# Patient Record
Sex: Female | Born: 1990 | Race: White | Hispanic: No | Marital: Married | Smoking: Never smoker
Health system: Southern US, Community
[De-identification: ages and names within clinical notes are randomized; demographics above are authoritative.]

## PROBLEM LIST (undated history)

## (undated) DIAGNOSIS — B589 Toxoplasmosis, unspecified: Secondary | ICD-10-CM

## (undated) DIAGNOSIS — F32A Depression, unspecified: Secondary | ICD-10-CM

## (undated) DIAGNOSIS — F419 Anxiety disorder, unspecified: Secondary | ICD-10-CM

## (undated) HISTORY — DX: Anxiety disorder, unspecified: F41.9

## (undated) HISTORY — DX: Toxoplasmosis, unspecified: B58.9

## (undated) HISTORY — DX: Depression, unspecified: F32.A

---
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
## 2008-07-16 ENCOUNTER — Emergency Department (HOSPITAL_COMMUNITY): Admission: EM | Admit: 2008-07-16 | Discharge: 2008-07-16 | Payer: Self-pay | Admitting: Emergency Medicine

## 2018-12-03 ENCOUNTER — Other Ambulatory Visit (HOSPITAL_COMMUNITY): Payer: Self-pay | Admitting: Internal Medicine

## 2018-12-03 ENCOUNTER — Ambulatory Visit (HOSPITAL_COMMUNITY)
Admission: RE | Admit: 2018-12-03 | Discharge: 2018-12-03 | Disposition: A | Discharge status: Home / Self Care | Payer: BC Managed Care – PPO | Source: Ambulatory Visit | Attending: Internal Medicine | Admitting: Internal Medicine

## 2018-12-03 DIAGNOSIS — R079 Chest pain, unspecified: Secondary | ICD-10-CM | POA: Diagnosis not present

## 2019-10-25 ENCOUNTER — Other Ambulatory Visit: Payer: Self-pay

## 2019-10-25 DIAGNOSIS — Z20822 Contact with and (suspected) exposure to covid-19: Secondary | ICD-10-CM

## 2019-10-27 LAB — NOVEL CORONAVIRUS, NAA: SARS-CoV-2, NAA: NOT DETECTED

## 2019-12-12 ENCOUNTER — Ambulatory Visit: Payer: BC Managed Care – PPO | Attending: Internal Medicine

## 2019-12-12 ENCOUNTER — Other Ambulatory Visit: Payer: Self-pay

## 2019-12-12 DIAGNOSIS — Z20822 Contact with and (suspected) exposure to covid-19: Secondary | ICD-10-CM

## 2019-12-13 LAB — NOVEL CORONAVIRUS, NAA: SARS-CoV-2, NAA: NOT DETECTED

## 2020-08-14 ENCOUNTER — Other Ambulatory Visit: Payer: Self-pay

## 2020-08-14 ENCOUNTER — Ambulatory Visit
Admission: EM | Admit: 2020-08-14 | Discharge: 2020-08-14 | Discharge status: Absent without leave | Payer: BC Managed Care – PPO

## 2020-11-21 ENCOUNTER — Other Ambulatory Visit: Payer: Self-pay

## 2020-11-21 ENCOUNTER — Ambulatory Visit (INDEPENDENT_AMBULATORY_CARE_PROVIDER_SITE_OTHER): Payer: BC Managed Care – PPO | Admitting: Orthopaedic Surgery

## 2020-11-21 ENCOUNTER — Encounter: Payer: Self-pay | Admitting: Orthopaedic Surgery

## 2020-11-21 DIAGNOSIS — M7711 Lateral epicondylitis, right elbow: Secondary | ICD-10-CM | POA: Diagnosis not present

## 2020-11-21 MED ORDER — LIDOCAINE HCL 1 % IJ SOLN
1.0000 mL | INTRAMUSCULAR | Status: AC | PRN
  Administered 2020-11-21: 1 mL

## 2020-11-21 NOTE — Progress Notes (Signed)
   Office Visit Note   Patient: Autumn Fields           Date of Birth: 05-09-1991           MRN: [id number] Visit Date: 11/21/2020              Requested by: Carylon Perches, MD [address] [place],  [place] [address] PCP: Carylon Perches, MD   Assessment & Plan: Visit Diagnoses:  1. Right tennis elbow     Plan: Exam and history consistent with lateral epicondylitis right elbow.  Long discussion regarding treatment options.  Ms. Lehtinen would like to try cortisone injection.  We will monitor her response.  No work tomorrow or Friday  Follow-Up Instructions: Return if symptoms worsen or fail to improve.   Orders:  Orders Placed This Encounter  Procedures  . Hand/UE Inj   No orders of the defined types were placed in this encounter.     Procedures: Hand/UE Inj for lateral epicondylitis on 11/21/2020 2:30 PM Details: 27 G needle, lateral approach Medications: 1 mL lidocaine 1 %  1 mL betamethasone      Clinical Data: No additional findings.   Subjective: Chief Complaint  Patient presents with  . Right Elbow - Pain  Patient presents today for right elbow pain. No known injury. She has been hurting for around 3 months on the lateral side of her elbow. She describes it as a "burning down the arm". She said that it aches constantly, but gets worse with lifting anything, or using that arm. 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 She is right hand dominant. She has taken Ibuprofen but states that it does not help.   HPI  Review of Systems   Objective: Vital Signs: Ht 5\' 8"  (1.727 m)   Wt 165 lb (74.8 kg)   BMI 25.09 kg/m   Physical Exam Constitutional:      Appearance: She is well-developed.  Eyes:     Pupils: Pupils are equal, round, and reactive to light.  Pulmonary:     Effort: Pulmonary effort is normal.  Skin:    General: Skin is warm and dry.  Neurological:     Mental Status: She is alert and oriented to person, place, and time.    Psychiatric:        Behavior: Behavior normal.     Ortho Exam wake alert and oriented x3.  Comfortable sitting.  Pain over the lateral epicondyles of right elbow without any skin changes.  Full range of motion.  Pain with grip and extension but not flexion.  Neurologically intact.  Full range of motion of elbow and wrist Specialty Comments:  No specialty comments available.  Imaging: No results found.   PMFS History: Patient Active Problem List   Diagnosis Date Noted  . Right tennis elbow 11/21/2020   Past Medical History:  Diagnosis Date  . Anxiety   . Depression   . Toxoplasmosis     History reviewed. No pertinent family history.  History reviewed. No pertinent surgical history. Social History   Occupational History  . Not on file  Tobacco Use  . Smoking status: Never Smoker  . Smokeless tobacco: Never Used  Substance and Sexual Activity  . Alcohol use: Not on file  . Drug use: Not on file  . Sexual activity: Not on file

## 2021-01-30 ENCOUNTER — Encounter: Payer: Self-pay | Admitting: Orthopaedic Surgery

## 2021-01-30 ENCOUNTER — Ambulatory Visit (INDEPENDENT_AMBULATORY_CARE_PROVIDER_SITE_OTHER): Payer: BC Managed Care – PPO | Admitting: Orthopaedic Surgery

## 2021-01-30 ENCOUNTER — Ambulatory Visit: Payer: BC Managed Care – PPO | Admitting: Orthopaedic Surgery

## 2021-01-30 ENCOUNTER — Other Ambulatory Visit: Payer: Self-pay

## 2021-01-30 VITALS — Ht 68.0 in | Wt 165.0 lb

## 2021-01-30 DIAGNOSIS — M25521 Pain in right elbow: Secondary | ICD-10-CM

## 2021-01-30 DIAGNOSIS — M7711 Lateral epicondylitis, right elbow: Secondary | ICD-10-CM

## 2021-01-30 NOTE — Progress Notes (Signed)
   Office Visit Note   Patient: Autumn Fields           Date of Birth: 08-12-1991           MRN: [id number] Visit Date: 01/30/2021              Requested by: Carylon Perches, MD [address] [place],  [place] [address] PCP: Carylon Perches, MD   Assessment & Plan: Visit Diagnoses:  1. Right tennis elbow   2. Right elbow pain     Plan: Recurrent symptoms of right tennis elbow.  Injected the elbow about 2 months ago with good relief only to have it recur.  Now having pain directly over the lateral epicondyle particularly with grip and extension.  Will order MRI scan to confirm diagnosis and try a course of physical therapy.  Briefly discussed possible surgical release  Follow-Up Instructions: Return After MRI scan right elbow.   Orders:  Orders Placed This Encounter  Procedures  . MR Elbow Right w/o contrast  . Ambulatory referral to Physical Therapy   No orders of the defined types were placed in this encounter.     Procedures: No procedures performed   Clinical Data: No additional findings.   Subjective: Chief Complaint  Patient presents with  . Right Elbow - Follow-up  Patient presents today for recurrent right elbow pain. She was here in December and received a cortisone injection. She said that it helped, but has since gotten worse. She tried a brace but it offered no relief. She applies ice in the evening.   HPI  Review of Systems   Objective: Vital Signs: Ht 5\' 8"  (1.727 m)   Wt 165 lb (74.8 kg)   BMI 25.09 kg/m   Physical Exam Constitutional:      Appearance: She is well-developed and well-nourished.  HENT:     Mouth/Throat:     Mouth: Oropharynx is clear and moist.  Eyes:     Extraocular Movements: EOM normal.     Pupils: Pupils are equal, round, and reactive to light.  Pulmonary:     Effort: Pulmonary effort is normal.  Skin:    General: Skin is warm and dry.  Neurological:     Mental Status: She is alert and oriented to person, place, and  time.  Psychiatric:        Mood and Affect: Mood and affect normal.        Behavior: Behavior normal.     Ortho Exam full range of motion right elbow with local tenderness directly over the lateral epicondyle.  Skin intact.  Good grip and release but there is pain over the lateral condyle with grip and extension more than with flexion.  Full pronation and supination.  Neurologically intact Specialty Comments:  No specialty comments available.  Imaging: No results found.   PMFS History: Patient Active Problem List   Diagnosis Date Noted  . Right tennis elbow 11/21/2020   Past Medical History:  Diagnosis Date  . Anxiety   . Depression   . Toxoplasmosis     History reviewed. No pertinent family history.  History reviewed. No pertinent surgical history. Social History   Occupational History  . Not on file  Tobacco Use  . Smoking status: Never Smoker  . Smokeless tobacco: Never Used  Substance and Sexual Activity  . Alcohol use: Not on file  . Drug use: Not on file  . Sexual activity: Not on file

## 2021-01-31 ENCOUNTER — Other Ambulatory Visit: Payer: Self-pay

## 2021-01-31 ENCOUNTER — Telehealth: Payer: Self-pay

## 2021-01-31 DIAGNOSIS — M7711 Lateral epicondylitis, right elbow: Secondary | ICD-10-CM

## 2021-01-31 DIAGNOSIS — M25521 Pain in right elbow: Secondary | ICD-10-CM

## 2021-01-31 NOTE — Telephone Encounter (Signed)
Crystal from [facility] called regarding referral that they received she is requesting the referral get switched to occupational therapy instead of physical therapy call back: [phone]

## 2021-01-31 NOTE — Telephone Encounter (Signed)
It has been changed to OT.

## 2021-02-18 ENCOUNTER — Ambulatory Visit (HOSPITAL_COMMUNITY): Payer: BC Managed Care – PPO

## 2021-02-20 ENCOUNTER — Ambulatory Visit (HOSPITAL_COMMUNITY)
Admission: RE | Admit: 2021-02-20 | Discharge: 2021-02-20 | Disposition: A | Discharge status: Home / Self Care | Payer: BC Managed Care – PPO | Source: Ambulatory Visit | Attending: Orthopaedic Surgery | Admitting: Orthopaedic Surgery

## 2021-02-20 ENCOUNTER — Other Ambulatory Visit: Payer: Self-pay

## 2021-02-20 DIAGNOSIS — M25521 Pain in right elbow: Secondary | ICD-10-CM | POA: Diagnosis not present

## 2021-02-27 ENCOUNTER — Other Ambulatory Visit: Payer: Self-pay

## 2021-02-27 ENCOUNTER — Encounter: Payer: Self-pay | Admitting: Orthopaedic Surgery

## 2021-02-27 ENCOUNTER — Ambulatory Visit (INDEPENDENT_AMBULATORY_CARE_PROVIDER_SITE_OTHER): Payer: BC Managed Care – PPO | Admitting: Orthopaedic Surgery

## 2021-02-27 VITALS — Ht 68.0 in | Wt 165.0 lb

## 2021-02-27 DIAGNOSIS — M7711 Lateral epicondylitis, right elbow: Secondary | ICD-10-CM | POA: Diagnosis not present

## 2021-02-27 DIAGNOSIS — M25521 Pain in right elbow: Secondary | ICD-10-CM | POA: Diagnosis not present

## 2021-02-27 NOTE — Progress Notes (Signed)
   Office Visit Note   Patient: Autumn Fields           Date of Birth: 1991-10-26           MRN: [id number] Visit Date: 02/27/2021              Requested by: Carylon Perches, MD [address] [place],  [place] [address] PCP: Carylon Perches, MD   Assessment & Plan: Visit Diagnoses:  1. Right elbow pain   2. Right tennis elbow     Plan: MRI scan of right elbow demonstrates common forearm extensor origin mild intrasubstance increased T2 signal consistent with lateral epicondylitis.  No tear.  Long discussion regarding the above.  Has had 1 cortisone injection in December with some relief but temporary.  Would like to try a course of physical therapy and knee pain.  Return in 1 month.  Always could consider surgery.  Continue with the tennis elbow band  Follow-Up Instructions: Return in about 1 month (around 03/30/2021).   Orders:  Orders Placed This Encounter  Procedures  . Ambulatory referral to Physical Therapy   No orders of the defined types were placed in this encounter.     Procedures: No procedures performed   Clinical Data: No additional findings.   Subjective: Chief Complaint  Patient presents with  . Right Elbow - Follow-up    MRI review  Patient presents today for follow up on her right elbow. She had an MRI and is here today to go over those results.  No change in symptoms  HPI  Review of Systems   Objective: Vital Signs: Ht 5\' 8"  (1.727 m)   Wt 165 lb (74.8 kg)   BMI 25.09 kg/m   Physical Exam Constitutional:      Appearance: She is well-developed and well-nourished.  HENT:     Mouth/Throat:     Mouth: Oropharynx is clear and moist.  Eyes:     Extraocular Movements: EOM normal.     Pupils: Pupils are equal, round, and reactive to light.  Pulmonary:     Effort: Pulmonary effort is normal.  Skin:    General: Skin is warm and dry.  Neurological:     Mental Status: She is alert and oriented to person, place, and time.  Psychiatric:        Mood  and Affect: Mood and affect normal.        Behavior: Behavior normal.     Ortho Exam right elbow with mild tenderness over the lateral epicondyle.  Full flexion extension.  Mild pain over the lateral epicondyle with grip and extension and minimally in flexion.  Neurologically intact  Specialty Comments:  No specialty comments available.  Imaging: No results found.   PMFS History: Patient Active Problem List   Diagnosis Date Noted  . Right tennis elbow 11/21/2020   Past Medical History:  Diagnosis Date  . Anxiety   . Depression   . Toxoplasmosis     History reviewed. No pertinent family history.  History reviewed. No pertinent surgical history. Social History   Occupational History  . Not on file  Tobacco Use  . Smoking status: Never Smoker  . Smokeless tobacco: Never Used  Substance and Sexual Activity  . Alcohol use: Not on file  . Drug use: Not on file  . Sexual activity: Not on file

## 2021-03-01 ENCOUNTER — Telehealth: Payer: Self-pay

## 2021-03-01 ENCOUNTER — Other Ambulatory Visit: Payer: Self-pay

## 2021-03-01 DIAGNOSIS — M25521 Pain in right elbow: Secondary | ICD-10-CM

## 2021-03-01 DIAGNOSIS — M7711 Lateral epicondylitis, right elbow: Secondary | ICD-10-CM

## 2021-03-01 NOTE — Telephone Encounter (Signed)
Crystal from [facility] called regarding a referral they received for patient to have therapy on her right elbow she is requesting the  referral to get changed to occupational therapy instead Fax:[phone] Call [phone]

## 2021-03-01 NOTE — Telephone Encounter (Signed)
Ordered entered 

## 2022-07-28 ENCOUNTER — Ambulatory Visit (INDEPENDENT_AMBULATORY_CARE_PROVIDER_SITE_OTHER): Payer: BC Managed Care – PPO

## 2022-07-28 ENCOUNTER — Ambulatory Visit
Admission: EM | Admit: 2022-07-28 | Discharge: 2022-07-28 | Disposition: A | Discharge status: Home / Self Care | Payer: BC Managed Care – PPO | Attending: Family Medicine | Admitting: Family Medicine

## 2022-07-28 DIAGNOSIS — R0602 Shortness of breath: Secondary | ICD-10-CM

## 2022-07-28 DIAGNOSIS — J209 Acute bronchitis, unspecified: Secondary | ICD-10-CM

## 2022-07-28 DIAGNOSIS — R059 Cough, unspecified: Secondary | ICD-10-CM

## 2022-07-28 MED ORDER — ALBUTEROL SULFATE HFA 108 (90 BASE) MCG/ACT IN AERS: 1.0000 | INHALATION_SPRAY | Freq: Four times a day (QID) | RESPIRATORY_TRACT | 0 refills | Status: AC | PRN

## 2022-07-28 MED ORDER — DEXAMETHASONE SODIUM PHOSPHATE 10 MG/ML IJ SOLN
10.0000 mg | Freq: Once | INTRAMUSCULAR | Status: AC
  Administered 2022-07-28: 10 mg via INTRAMUSCULAR

## 2022-07-28 MED ORDER — PROMETHAZINE-DM 6.25-15 MG/5ML PO SYRP: 5.0000 mL | ORAL_SOLUTION | Freq: Four times a day (QID) | ORAL | 0 refills | Status: AC | PRN

## 2022-07-28 NOTE — ED Provider Notes (Signed)
[facility]    CSN: [id number] Arrival date & time: 07/28/22  0813      History   Chief Complaint Chief Complaint  Patient presents with   Cough    HPI Autumn Fields is a 31 y.o. female.   Presenting today with ongoing chest tightness, cough, mild shortness of breath following COVID infection 12 days ago.  Completed course of Paxlovid which helped with her other symptoms but not her chest symptoms.  Denies ongoing fever, chest pain, sore throat, congestion, abdominal pain, nausea vomiting or diarrhea.  Not currently taking any medications for symptoms.   Past Medical History:  Diagnosis Date   Anxiety    Depression    Toxoplasmosis     Patient Active Problem List   Diagnosis Date Noted   Right tennis elbow 11/21/2020    History reviewed. No pertinent surgical history.  OB History   No obstetric history on file.      Home Medications    Prior to Admission medications   Medication Sig Start Date End Date Taking? Authorizing Provider  albuterol (VENTOLIN HFA) 108 (90 Base) MCG/ACT inhaler Inhale 1-2 puffs into the lungs every 6 (six) hours as needed for wheezing or shortness of breath. 07/28/22  Yes Particia Nearing, PA-C  citalopram (CELEXA) 20 MG tablet Take 20 mg by mouth daily. 10/11/20  Yes [provider]  promethazine-dextromethorphan (PROMETHAZINE-DM) 6.25-15 MG/5ML syrup Take 5 mLs by mouth 4 (four) times daily as needed. 07/28/22  Yes Particia Nearing, PA-C  ALPRAZolam Prudy Feeler) 0.5 MG tablet Take 0.5 mg by mouth 2 (two) times daily as needed. 10/11/20   [provider]  TRI-SPRINTEC 0.18/0.215/0.25 MG-35 MCG tablet Take 1 tablet by mouth daily. 09/04/20   [provider]    Family History History reviewed. No pertinent family history.  Social History Social History   Tobacco Use   Smoking status: Never   Smokeless tobacco: Never     Allergies   Patient has no known allergies.   Review of  Systems Review of Systems Per HPI  Physical Exam Triage Vital Signs ED Triage Vitals  Enc Vitals Group     BP 07/28/22 0852 116/80     Pulse Rate 07/28/22 0852 82     Resp 07/28/22 0852 18     Temp 07/28/22 0852 98.6 F (37 C)     Temp src --      SpO2 07/28/22 0852 99 %     Weight --      Height --      Head Circumference --      Peak Flow --      Pain Score 07/28/22 0835 0     Pain Loc --      Pain Edu? --      Excl. in GC? --    No data found.  Updated Vital Signs BP 116/80   Pulse 82   Temp 98.6 F (37 C)   Resp 18   LMP 07/15/2022   SpO2 99%   Visual Acuity Right Eye Distance:   Left Eye Distance:   Bilateral Distance:    Right Eye Near:   Left Eye Near:    Bilateral Near:     Physical Exam Vitals and nursing note reviewed.  Constitutional:      Appearance: Normal appearance. She is not ill-appearing.  HENT:     Head: Atraumatic.     Right Ear: Tympanic membrane normal.     Left Ear:  Tympanic membrane normal.     Nose: Nose normal.     Mouth/Throat:     Mouth: Mucous membranes are moist.  Eyes:     Extraocular Movements: Extraocular movements intact.     Conjunctiva/sclera: Conjunctivae normal.  Cardiovascular:     Rate and Rhythm: Normal rate and regular rhythm.     Heart sounds: Normal heart sounds.  Pulmonary:     Effort: Pulmonary effort is normal.     Breath sounds: Wheezing present. No rales.     Comments: Trace wheezes right Musculoskeletal:        General: Normal range of motion.     Cervical back: Normal range of motion and neck supple.  Skin:    General: Skin is warm and dry.  Neurological:     Mental Status: She is alert and oriented to person, place, and time.  Psychiatric:        Mood and Affect: Mood normal.        Thought Content: Thought content normal.        Judgment: Judgment normal.      UC Treatments / Results  Labs (all labs ordered are listed, but only abnormal results are displayed) Labs Reviewed - No data  to display  EKG   Radiology DG Chest 2 View  Result Date: 07/28/2022 CLINICAL DATA:  cough, SOB 2 weeks post-COVID EXAM: CHEST - 2 VIEW COMPARISON:  Radiograph 12/03/2018 FINDINGS: The cardiomediastinal silhouette is within normal limits. There is no focal airspace consolidation. There is no pleural effusion. No pneumothorax. No acute osseous abnormality. IMPRESSION: No evidence of acute cardiopulmonary disease. Electronically Signed   By: Caprice Renshaw M.D.   On: 07/28/2022 09:16    Procedures Procedures (including critical care time)  Medications Ordered in UC Medications  dexamethasone (DECADRON) injection 10 mg (10 mg Intramuscular Given 07/28/22 0932)    Initial Impression / Assessment and Plan / UC Course  I have reviewed the triage vital signs and the nursing notes.  Pertinent labs & imaging results that were available during my care of the patient were reviewed by me and considered in my medical decision making (see chart for details).     Vital signs benign and reassuring, chest x-ray without evidence of acute cardiopulmonary abnormality.  Suspect post-COVID orchitis.  Treat with IM Decadron, albuterol inhaler, Phenergan DM and supportive over-the-counter medications and home care.  Return for worsening symptoms.  Final Clinical Impressions(s) / UC Diagnoses   Final diagnoses:  Acute bronchitis, unspecified organism   Discharge Instructions   None    ED Prescriptions     Medication Sig Dispense Auth. Provider   albuterol (VENTOLIN HFA) 108 (90 Base) MCG/ACT inhaler Inhale 1-2 puffs into the lungs every 6 (six) hours as needed for wheezing or shortness of breath. 18 g Roosvelt Maser [place], [place]   promethazine-dextromethorphan (PROMETHAZINE-DM) 6.25-15 MG/5ML syrup Take 5 mLs by mouth 4 (four) times daily as needed. 100 mL Particia Nearing, [place]      PDMP not reviewed this encounter.   Particia Nearing, [place] 07/28/22 1005

## 2022-07-28 NOTE — ED Triage Notes (Signed)
Pt presents with c/o chest tightness and cough that is 2 weeks post covid. Pt has completed antiviral medications. Pt request  chest x ray

## 2023-02-28 IMAGING — MR MR ELBOW*R* W/O CM
6 series · 40 of 40 positions shown · non-contrast
Comparison: None.

CLINICAL DATA: Right elbow pain and swelling for 5 months. No known
injury.

EXAM:
MRI OF THE RIGHT ELBOW WITHOUT CONTRAST
TECHNIQUE: Multiplanar, multisequence MR imaging of the elbow was performed. No
intravenous contrast was administered.

[Series 3: T1 · axial · right · 3.0mm · 0.47mm/px · z∈[-65,+25]mm · 7 of 25 slices shown (1 of 2)]
[im 1/25]
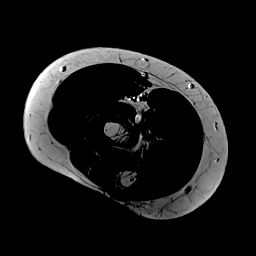
[im 5/25]
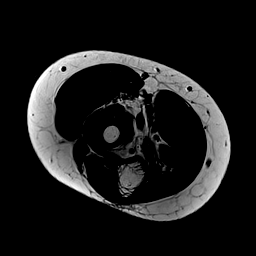
[im 9/25]
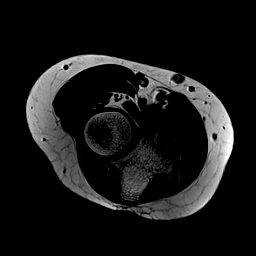
[im 13/25]
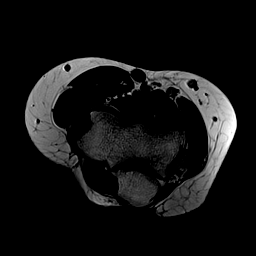
[im 17/25]
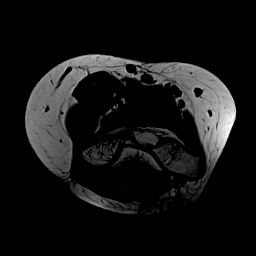
[im 21/25]
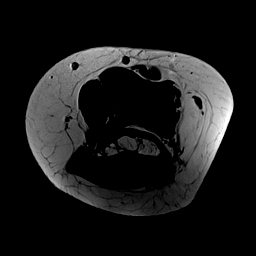
[im 25/25]
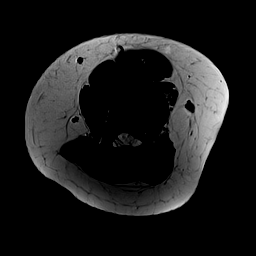

[Series 4: T2 fat-sat · axial · right · 3.0mm · 0.47mm/px · z∈[-65,+25]mm · 7 of 25 slices shown (1 of 2)]
[im 1/25]
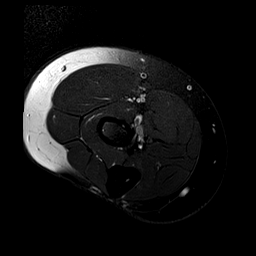
[im 5/25]
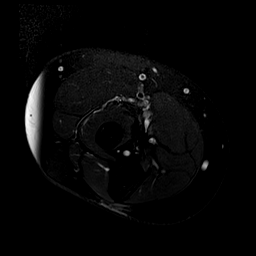
[im 9/25]
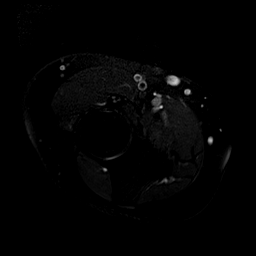
[im 13/25]
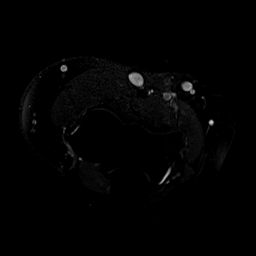
[im 17/25]
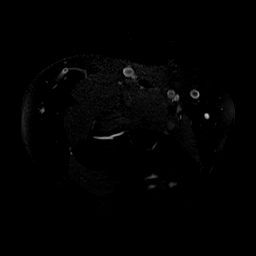
[im 21/25]
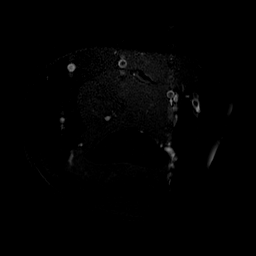
[im 25/25]
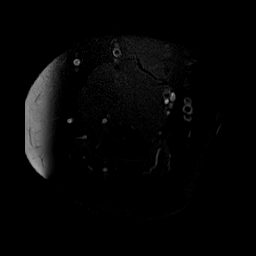

[Series 5: T2 fat-sat · coronal · right · 3.0mm · 0.55mm/px · 6 of 20 slices shown (2 of 2)]
[im 1/20]
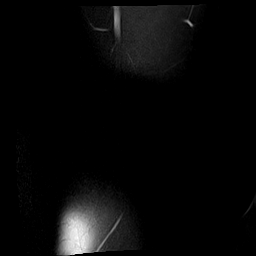
[im 4/20]
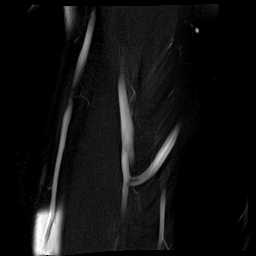
[im 8/20]
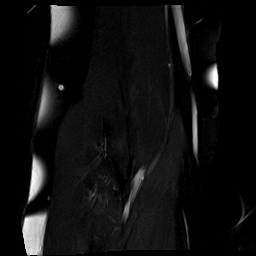
[im 12/20]
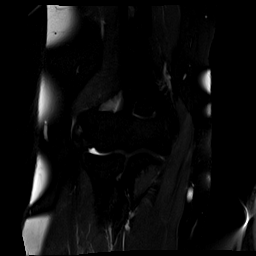
[im 16/20]
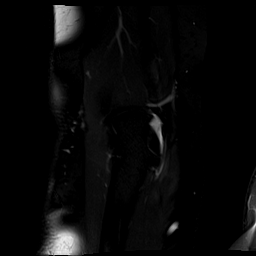
[im 20/20]
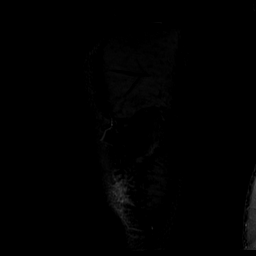

[Series 6: T1 · coronal · right · 3.0mm · 0.55mm/px · 6 of 20 slices shown (2 of 2)]
[im 1/20]
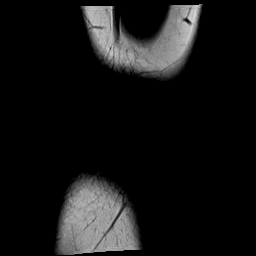
[im 4/20]
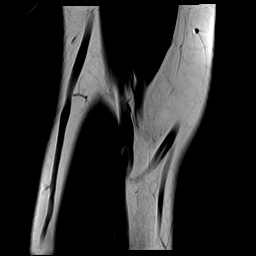
[im 8/20]
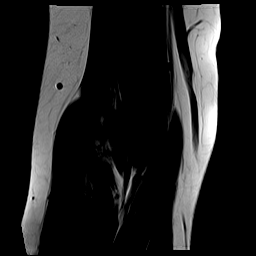
[im 12/20]
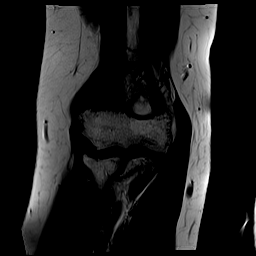
[im 16/20]
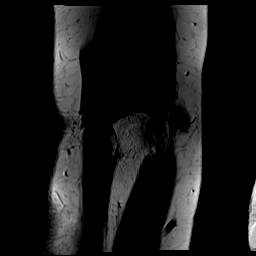
[im 20/20]
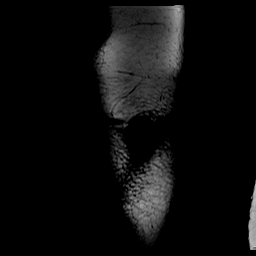

[Series 7: PD fat-sat · sagittal · right · 3.0mm · 0.55mm/px · 8 of 26 slices shown]
[im 1/26]
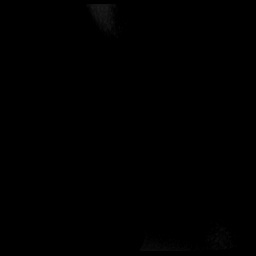
[im 4/26]
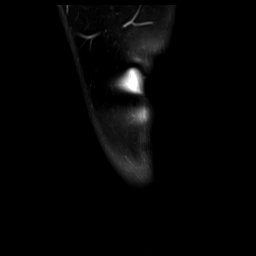
[im 8/26]
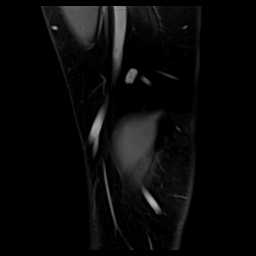
[im 11/26]
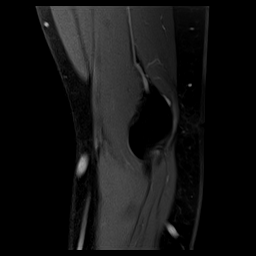
[im 15/26]
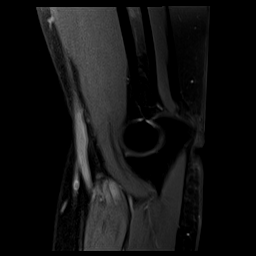
[im 18/26]
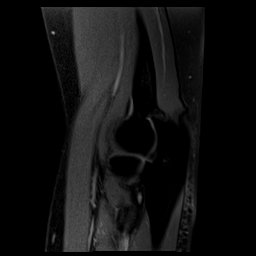
[im 22/26]
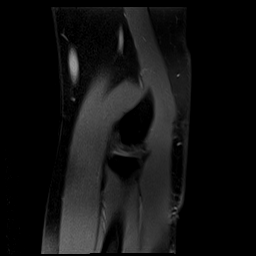
[im 26/26]
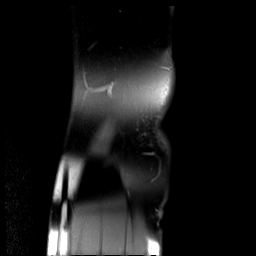

[Series 8: STIR · coronal · right · 3.0mm · 0.47mm/px · 6 of 20 slices shown]
[im 1/20]
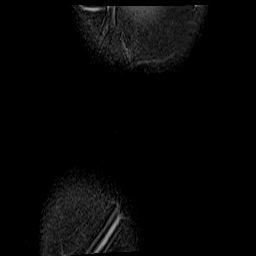
[im 4/20]
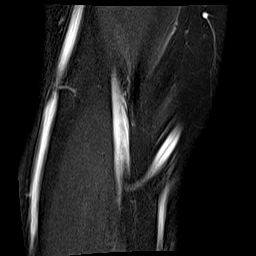
[im 8/20]
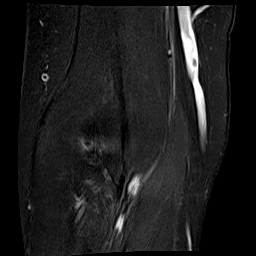
[im 12/20]
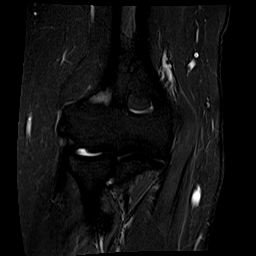
[im 16/20]
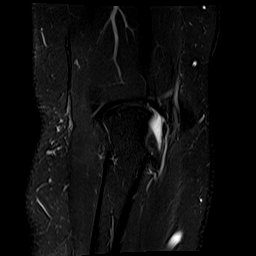
[im 20/20]
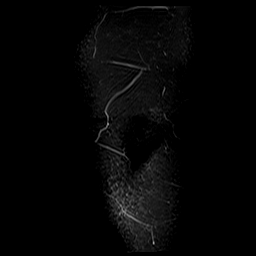

[40 of 40 positions shown; findings below may reference images not displayed]

FINDINGS: TENDONS

Common forearm flexor origin: Intact.

Common forearm extensor origin: Mild intrasubstance increased T2
signal is consistent with lateral epicondylitis. No tear.

Biceps: Intact.

Triceps: Intact.

LIGAMENTS

Medial stabilizers: Intact.

Lateral stabilizers:  Intact.

Cartilage: Normal.

Joint: No effusion.

Cubital tunnel: Normal.

Bones: Normal marrow signal throughout.
IMPRESSION: Mild appearing lateral epicondylitis without tendon or ligament
tear. The exam is otherwise negative.
